# Patient Record
Sex: Male | Born: 1980 | Race: Black or African American | Hispanic: No | Marital: Single
Health system: Southern US, Community
[De-identification: ages and names within clinical notes are randomized; demographics above are authoritative.]

---

## 2002-08-06 ENCOUNTER — Emergency Department (HOSPITAL_COMMUNITY): Admission: EM | Admit: 2002-08-06 | Discharge: 2002-08-06 | Payer: Self-pay | Admitting: Emergency Medicine

## 2003-01-17 ENCOUNTER — Emergency Department (HOSPITAL_COMMUNITY): Admission: EM | Admit: 2003-01-17 | Discharge: 2003-01-17 | Payer: Self-pay | Admitting: Emergency Medicine

## 2003-01-17 ENCOUNTER — Encounter: Payer: Self-pay | Admitting: Emergency Medicine

## 2021-10-18 ENCOUNTER — Emergency Department (HOSPITAL_COMMUNITY)
Admission: EM | Admit: 2021-10-18 | Discharge: 2021-10-19 | Disposition: A | Discharge status: Home / Self Care | Payer: BC Managed Care – PPO | Attending: Emergency Medicine | Admitting: Emergency Medicine

## 2021-10-18 ENCOUNTER — Emergency Department (HOSPITAL_COMMUNITY): Payer: BC Managed Care – PPO

## 2021-10-18 ENCOUNTER — Other Ambulatory Visit: Payer: Self-pay

## 2021-10-18 DIAGNOSIS — R059 Cough, unspecified: Secondary | ICD-10-CM

## 2021-10-18 DIAGNOSIS — C3490 Malignant neoplasm of unspecified part of unspecified bronchus or lung: Secondary | ICD-10-CM

## 2021-10-18 DIAGNOSIS — C7931 Secondary malignant neoplasm of brain: Secondary | ICD-10-CM | POA: Insufficient documentation

## 2021-10-18 DIAGNOSIS — R569 Unspecified convulsions: Secondary | ICD-10-CM

## 2021-10-18 MED ORDER — LEVETIRACETAM IN NACL 1000 MG/100ML IV SOLN
1000.0000 mg | Freq: Once | INTRAVENOUS | Status: AC
  Administered 2021-10-19: 1000 mg via INTRAVENOUS
  Filled 2021-10-18: qty 100

## 2021-10-18 MED ORDER — PROCHLORPERAZINE EDISYLATE 10 MG/2ML IJ SOLN
10.0000 mg | Freq: Once | INTRAMUSCULAR | Status: AC
  Administered 2021-10-19: 10 mg via INTRAVENOUS
  Filled 2021-10-18: qty 2

## 2021-10-18 MED ORDER — SODIUM CHLORIDE 0.9 % IV BOLUS
1000.0000 mL | Freq: Once | INTRAVENOUS | Status: AC
  Administered 2021-10-19: 1000 mL via INTRAVENOUS

## 2021-10-18 NOTE — ED Provider Notes (Signed)
[place] EMERGENCY DEPARTMENT Provider Note  CSN: [id number] Arrival date & time: 10/18/21 2250  Chief Complaint(s) Seizures  HPI Andrew Baxter is a 41 y.o. male    Seizures Seizure activity on arrival: no   Seizure type:  Grand mal Episode characteristics: generalized shaking   Postictal symptoms: confusion and somnolence   Return to baseline: yes   Severity:  Moderate Duration:  5 minutes Timing:  Once Progression:  Resolved Context comment:  H/o metastatic lung cancer to brain (worsening tumor burden noted on MRI from [place] taken last week) History of seizures: yes (patient has had several seizures in the past. once every several years. not on AE)    Past Medical History No past medical history on file. There are no problems to display for this patient.  Home Medication(s) Prior to Admission medications   Medication Sig Start Date End Date Taking? Authorizing Provider  dexamethasone (DECADRON) 4 MG tablet Take 1.5 tablets (6 mg total) by mouth 4 (four) times daily for 5 days. 10/19/21 10/24/21 Yes Coal Nearhood, Grayce Sessions, MD  levETIRAcetam (KEPPRA) 500 MG tablet Take 1 tablet (500 mg total) by mouth 2 (two) times daily for 5 days. 10/19/21 10/24/21 Yes Isaiah Torok, Grayce Sessions, MD  NAPROXEN PO Take 1 tablet by mouth daily as needed (mild pain).   Yes [provider]  KEYTRUDA 100 MG/4ML SOLN Inject into the vein. Patient not taking: Reported on 10/19/2021 09/09/21   [provider]                                                                                                                                    Allergies Penicillins, Pork-derived products, and Shrimp (diagnostic)  Review of Systems Review of Systems  Neurological:  Positive for seizures.  As noted in HPI  Physical Exam Vital Signs  I have reviewed the triage vital signs BP 107/67    Pulse 81    Temp 98.9 F (37.2 C) (Oral)    Resp 16    Ht _0  (1.753 m)     Wt 68 kg    SpO2 95%    BMI 22.15 kg/m   Physical Exam Vitals reviewed.  Constitutional:      General: He is not in acute distress.    Appearance: He is well-developed. He is not diaphoretic.  HENT:     Head: Normocephalic and atraumatic.     Nose: Nose normal.  Eyes:     General: No scleral icterus.       Right eye: No discharge.        Left eye: No discharge.     Conjunctiva/sclera: Conjunctivae normal.     Pupils: Pupils are equal, round, and reactive to light.  Cardiovascular:     Rate and Rhythm: Normal rate and regular rhythm.     Heart sounds: No murmur heard.   No friction rub. No gallop.  Pulmonary:  Effort: Pulmonary effort is normal. No respiratory distress.     Breath sounds: Normal breath sounds. No stridor. No rales.  Abdominal:     General: There is no distension.     Palpations: Abdomen is soft.     Tenderness: There is no abdominal tenderness.  Musculoskeletal:        General: No tenderness.     Cervical back: Normal range of motion and neck supple.  Skin:    General: Skin is warm and dry.     Findings: No erythema or rash.  Neurological:     Mental Status: He is alert and oriented to person, place, and time.     Comments: Mental Status:  Alert and oriented to person, place, and time.  Attention and concentration normal.  Speech clear.  Recent memory is intact  Cranial Nerves:  II Visual Fields: Intact to confrontation. Visual fields intact. III, IV, VI: Pupils equal and reactive to light and near. Full eye movement without nystagmus  V Facial Sensation: Normal. No weakness of masticatory muscles  VII: No facial weakness or asymmetry  VIII Auditory Acuity: Grossly normal  IX/X: The uvula is midline; the palate elevates symmetrically  XI: Normal sternocleidomastoid and trapezius strength  XII: The tongue is midline. No atrophy or fasciculations.   Motor System: Muscle Strength: 5/5 and symmetric in the upper and lower extremities. No pronation  or drift.  Muscle Tone: Tone and muscle bulk are normal in the upper and lower extremities.  Coordination: Intact finger-to-nose No tremor.  Sensation: Intact to light touch      ED Results and Treatments Labs (all labs ordered are listed, but only abnormal results are displayed) Labs Reviewed  CBC WITH DIFFERENTIAL/PLATELET - Abnormal; Notable for the following components:      Result Value   Lymphs Abs 0.4 (*)    All other components within normal limits  COMPREHENSIVE METABOLIC PANEL - Abnormal; Notable for the following components:   Glucose, Bld 108 (*)    Calcium 8.1 (*)    All other components within normal limits  PHOSPHORUS - Abnormal; Notable for the following components:   Phosphorus 1.4 (*)    All other components within normal limits                                                                                                                         EKG  EKG Interpretation  Date/Time:    Ventricular Rate:    PR Interval:    QRS Duration:   QT Interval:    QTC Calculation:   R Axis:     Text Interpretation:         Radiology CT Head Wo Contrast  Result Date: 10/18/2021 CLINICAL DATA:  Seizure disorder, history of metastatic brain cancer EXAM: CT HEAD WITHOUT CONTRAST TECHNIQUE: Contiguous axial images were obtained from the base of the skull through the vertex without intravenous contrast. RADIATION DOSE REDUCTION: This exam was performed according to the departmental  dose-optimization program which includes automated exposure control, adjustment of the mA and/or kV according to patient size and/or use of iterative reconstruction technique. COMPARISON:  None. FINDINGS: Brain: There is a 1.8 x 1.3 x 1.8 cm hyperdense mass within the inferior aspect of the right temporal lobe, with mild surrounding edema, consistent with given history of metastatic disease. No other masses identified on this unenhanced exam. If further evaluation is desired, brain MRI with and  without contrast would be useful. Encephalomalacia within the right occipital lobe, with evidence of prior craniotomy, consistent with the cephalo malacia and likely from prior mass resection. No evidence of acute infarct or hemorrhage. The lateral ventricles and midline structures are otherwise unremarkable. No acute extra-axial fluid collections. No mass effect. Vascular: No hyperdense vessel or unexpected calcification. Skull: Previous right occipital craniotomy. No acute or destructive bony lesions. Sinuses/Orbits: No acute finding. Other: None. IMPRESSION: 1. 1.8 cm hyperdense mass within the inferior aspect of the right temporal lobe, consistent with given history of metastatic disease. Mild surrounding edema without significant mass effect. No other intracranial masses. If further evaluation is desired, MRI could be performed. 2. Right occipital encephalomalacia with overlying changes of prior craniotomy, consistent with prior mass resection. 3. No evidence of acute infarct or hemorrhage. Electronically Signed   By: Randa Ngo M.D.   On: 10/18/2021 23:59   DG Chest Port 1 View  Result Date: 10/18/2021 CLINICAL DATA:  Cough and seizure EXAM: PORTABLE CHEST 1 VIEW COMPARISON:  None. FINDINGS: Marked leftward mediastinal shift with shallow inflation of the left lung, a medium-sized left pleural effusion that is circumferential and multifocal left airspace opacities. Right lung is clear. IMPRESSION: Medium-sized left pleural effusion and multifocal left airspace opacities. Area of consolidation at the left lung base could indicate atelectasis, infection and/or mass. Electronically Signed   By: Ulyses Jarred M.D.   On: 10/18/2021 23:54    Pertinent labs & imaging results that were available during my care of the patient were reviewed by me and considered in my medical decision making (see MDM for details).  Medications Ordered in ED Medications  sodium chloride 0.9 % bolus 1,000 mL (0 mLs Intravenous  Stopped 10/19/21 0234)  levETIRAcetam (KEPPRA) IVPB 1000 mg/100 mL premix (0 mg Intravenous Stopped 10/19/21 0029)  prochlorperazine (COMPAZINE) injection 10 mg (10 mg Intravenous Given 10/19/21 0029)  dexamethasone (DECADRON) injection 6 mg (6 mg Intravenous Given 10/19/21 0255)                                                                                                                                     Procedures Procedures  (including critical care time)  Medical Decision Making / ED Course        Seizure Patient with prior history of seizures not on antiepileptic's Known to have worsening metastatic lung cancer to the brain noted on record review of MRI from [facility], performed last week.  Currently getting chemotherapy infusion which she is scheduled for tomorrow.  Patient also scheduled for radiation therapy on January 24. Will get screening labs to assess for any electrolyte derangements.  CT head to assess for any complication from metastatic disease including vasogenic edema or bleeding. Patient also reported known pleural effusion. Started having cough similar to prior large effusion requiring thoracentesis. Will get CXR to assess for effusion size and to rule out PNA  Work-up ordered to assess concerns above. Labs and imagine Independently interpreted by me and noted below: CT head notable for mild edema to right temporal region. Likely associated with a met. No obvious bleed. Radiology confirmed and noted additional findings above. CXR notable for moderate left pleural effusion with low aeration. When compared to reads of CXR/CTs from OSH, it appears to be similar.  Management: Seizure Initial management included IVF Keppra load Started complaining of headache and nausea. Given compazine Headache and nausea improved Known metastatic brain lesion on CT with mild edema to right temporal region. Consulted [facility] and spoke with Dr. Isabella Stalling who  recommended overnight observation, 30m decadron q6hr and agreed with Keppra. He feels patient can be discharged in the morning and call clinic so they can work him in for infusion and follow up.   Reassessment: 6:41 AM Remained HDS. No hypoxia. No recurrent seizure activity    Final Clinical Impression(s) / ED Diagnoses Final diagnoses:  Cough  Seizure ([facility]  Lung cancer metastatic to brain ([facility]   The patient appears reasonably screened and/or stabilized for discharge and I doubt any other medical condition or [facility], evaluation, or treatment in the ED at this time prior to discharge. Safe for discharge with strict return precautions.  Disposition: Discharge  Condition: Good  I have discussed the results, Dx and Tx plan with the patient/family who expressed understanding and agree(s) with the plan. Discharge instructions discussed at length. The patient/family was given strict return precautions who verbalized understanding of the instructions. No further questions at time of discharge.    ED Discharge Orders          Ordered    dexamethasone (DECADRON) 4 MG tablet  4 times daily        10/19/21 0618    levETIRAcetam (KEPPRA) 500 MG tablet  2 times daily        10/19/21 06712             Follow Up: HPernell Dupre MD [address] [id number] [phone] Call  to schedule close follow up for your infusion and reassessment            This chart was dictated using voice recognition software.  Despite best efforts to proofread,  errors can occur which can change the documentation meaning.    CFatima Blank MD 10/19/21 [phone]

## 2021-10-18 NOTE — ED Notes (Signed)
Mother at bedside.

## 2021-10-18 NOTE — ED Triage Notes (Addendum)
Pt arrived via [facility] for cc of seizure. Pt was in car with family, witnessed ~5 minute seizure. PMH of metastatic brain cancer (bone, aorta), receiving treatment at this time. A&Ox4, GCS 15 at time of EMS arrival. No mouth trauma, no incontinence.18g LAC.  BP 114/74 HR 98 SPO2 97% RA RR 16 CBG 137

## 2021-10-19 LAB — COMPREHENSIVE METABOLIC PANEL
ALT: 16 U/L (ref 0–44)
AST: 27 U/L (ref 15–41)
Albumin: 3.6 g/dL (ref 3.5–5.0)
Alkaline Phosphatase: 112 U/L (ref 38–126)
Anion gap: 8 (ref 5–15)
BUN: 10 mg/dL (ref 6–20)
CO2: 23 mmol/L (ref 22–32)
Calcium: 8.1 mg/dL — ABNORMAL LOW (ref 8.9–10.3)
Chloride: 104 mmol/L (ref 98–111)
Creatinine, Ser: 0.89 mg/dL (ref 0.61–1.24)
GFR, Estimated: >60 mL/min (ref >60)
Glucose, Bld: 108 mg/dL — ABNORMAL HIGH (ref 70–99)
Potassium: 4 mmol/L (ref 3.5–5.1)
Sodium: 135 mmol/L (ref 135–145)
Total Bilirubin: 0.9 mg/dL (ref 0.3–1.2)
Total Protein: 7.1 g/dL (ref 6.5–8.1)

## 2021-10-19 LAB — CBC WITH DIFFERENTIAL/PLATELET
Abs Immature Granulocytes: 0.04 10*3/uL (ref 0.00–0.07)
Basophils Absolute: 0 10*3/uL (ref 0.0–0.1)
Basophils Relative: 0 %
Eosinophils Absolute: 0.1 10*3/uL (ref 0.0–0.5)
Eosinophils Relative: 2 %
HCT: 41.9 % (ref 39.0–52.0)
Hemoglobin: 13.9 g/dL (ref 13.0–17.0)
Immature Granulocytes: 1 %
Lymphocytes Relative: 10 %
Lymphs Abs: 0.4 10*3/uL — ABNORMAL LOW (ref 0.7–4.0)
MCH: 28.5 pg (ref 26.0–34.0)
MCHC: 33.2 g/dL (ref 30.0–36.0)
MCV: 86 fL (ref 80.0–100.0)
Monocytes Absolute: 0.8 10*3/uL (ref 0.1–1.0)
Monocytes Relative: 19 %
Neutro Abs: 2.8 10*3/uL (ref 1.7–7.7)
Neutrophils Relative %: 68 %
Platelets: 207 10*3/uL (ref 150–400)
RBC: 4.87 MIL/uL (ref 4.22–5.81)
RDW: 15.4 % (ref 11.5–15.5)
WBC: 4.1 10*3/uL (ref 4.0–10.5)
nRBC: 0 % (ref 0.0–0.2)

## 2021-10-19 LAB — PHOSPHORUS: Phosphorus: 1.4 mg/dL — ABNORMAL LOW (ref 2.5–4.6)

## 2021-10-19 MED ORDER — LEVETIRACETAM 500 MG PO TABS: 500.0000 mg | ORAL_TABLET | Freq: Two times a day (BID) | ORAL | 0 refills | Status: AC

## 2021-10-19 MED ORDER — DEXAMETHASONE 4 MG PO TABS
6.0000 mg | ORAL_TABLET | Freq: Four times a day (QID) | ORAL | 0 refills | Status: AC
Start: 2021-10-19 — End: 2021-10-24

## 2021-10-19 MED ORDER — DEXAMETHASONE SODIUM PHOSPHATE 10 MG/ML IJ SOLN
6.0000 mg | Freq: Once | INTRAMUSCULAR | Status: AC
  Administered 2021-10-19: 6 mg via INTRAVENOUS
  Filled 2021-10-19: qty 1

## 2021-10-19 NOTE — ED Notes (Signed)
Pt verbalized understanding of d/c instructions, meds and followup care. Denies questions. VSS, no distress noted. Steady gait to exit with all belongings.  ?

## 2021-10-19 NOTE — ED Notes (Signed)
Is a patient of Dr. Arletha Grippe at [facility], paged doctor on call, Dr. Isabella Stalling for Dr. Leonette Monarch

## 2021-10-19 NOTE — Discharge Instructions (Addendum)
I sent  your prescriptions to the local 24 hour [facility] so you can have your medication before leaving town. Please take your Keppra and decadron around 8 am.  Please call the oncology office so they can work you in today for your infusion and close follow up.

## 2023-03-05 DEATH — deceased

## 2023-12-11 IMAGING — CT CT HEAD W/O CM
4 series · 14 of 47 positions shown, 16 images · non-contrast
Comparison: None.

CLINICAL DATA: Seizure disorder, history of metastatic brain cancer



[Series 3: head without · axial · non-contrast · 0.47mm/px · z∈[-138,-18]mm · 7 of 33 slices shown, 9 images]
[im 5/33  brain]
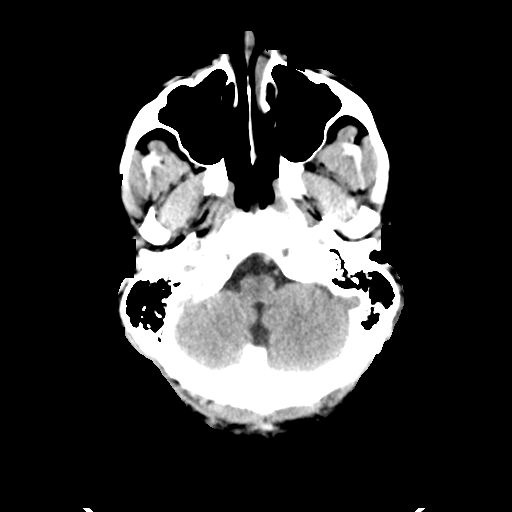
[im 5/33  bone]
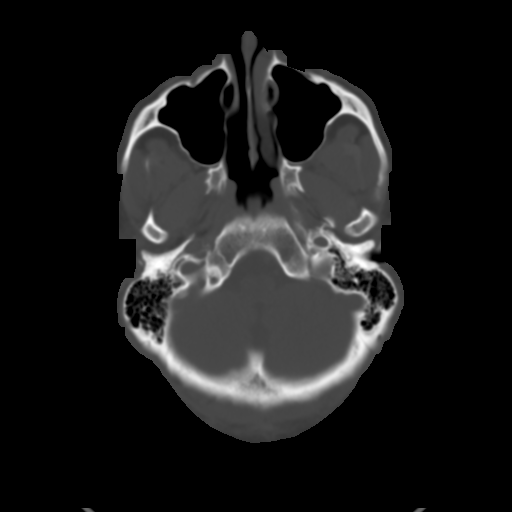
[im 9/33  brain]
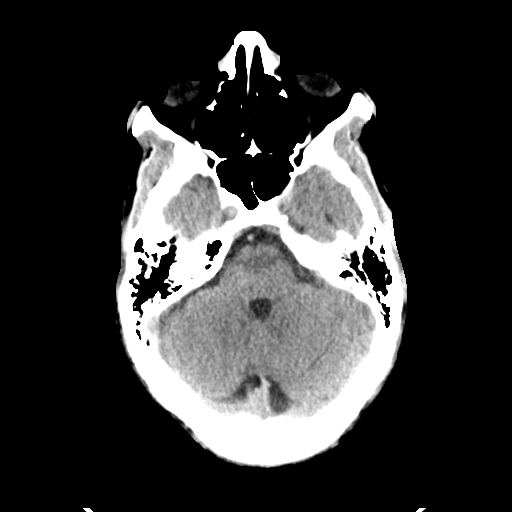
[im 13/33  brain]
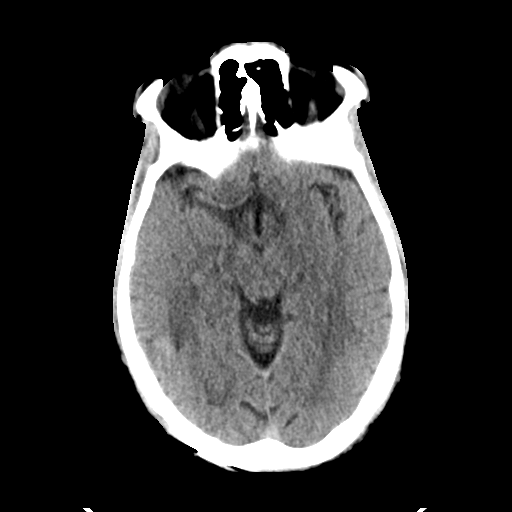
[im 17/33  brain]
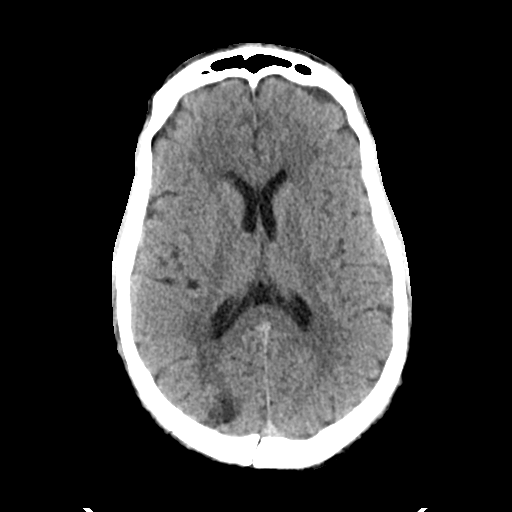
[im 21/33  brain]
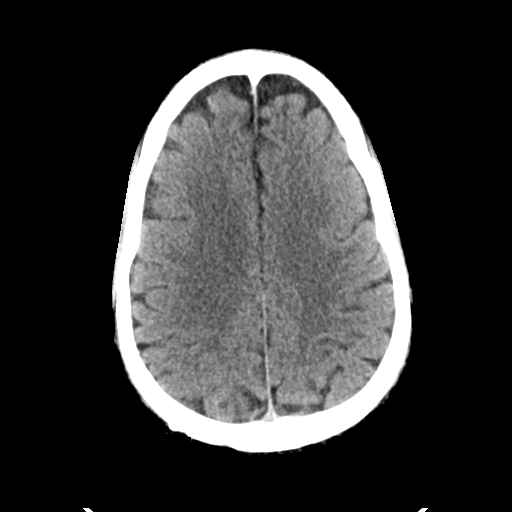
[im 21/33  bone]
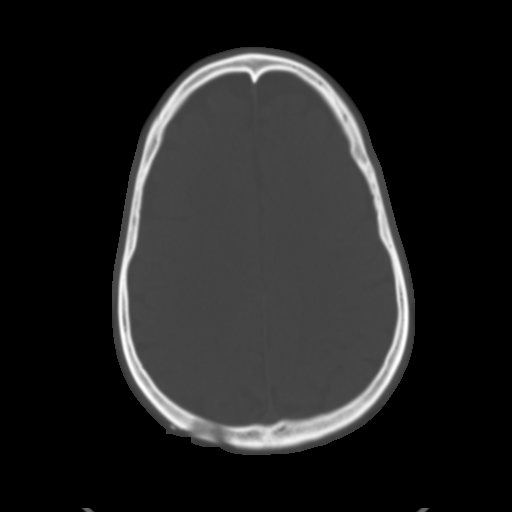
[im 25/33  brain]
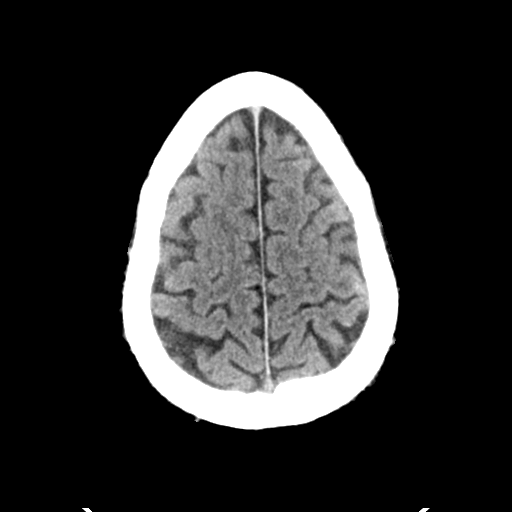
[im 29/33  brain]
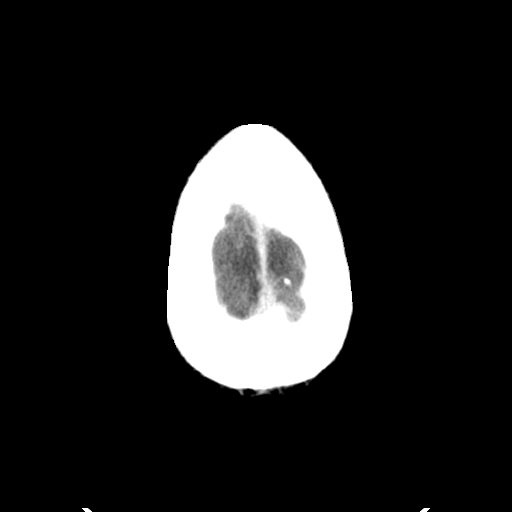

[Series 4: head bone · axial · 0.47mm/px · 1 of 82 slices shown]
[im 9/82  bone]
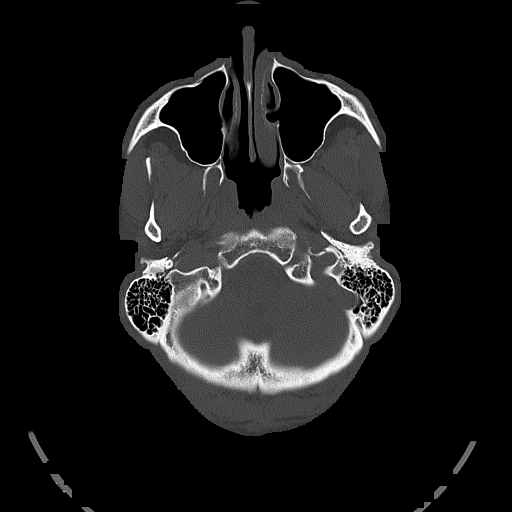

[Series 5: head without cor · coronal · non-contrast · 0.30mm/px · 3 of 77 slices shown]
[im 26/77  brain]
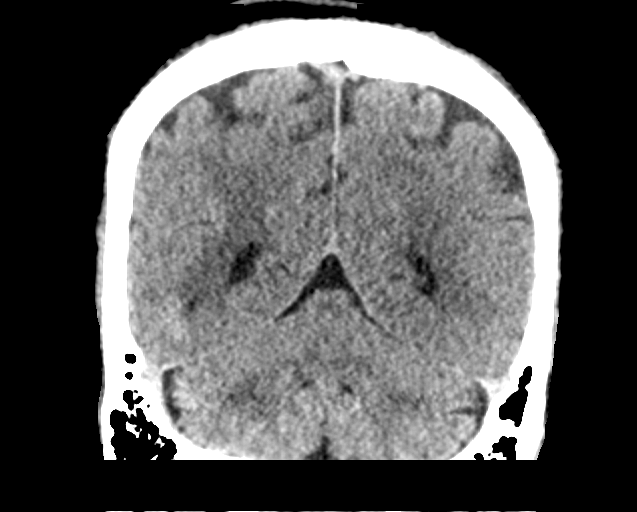
[im 34/77  brain]
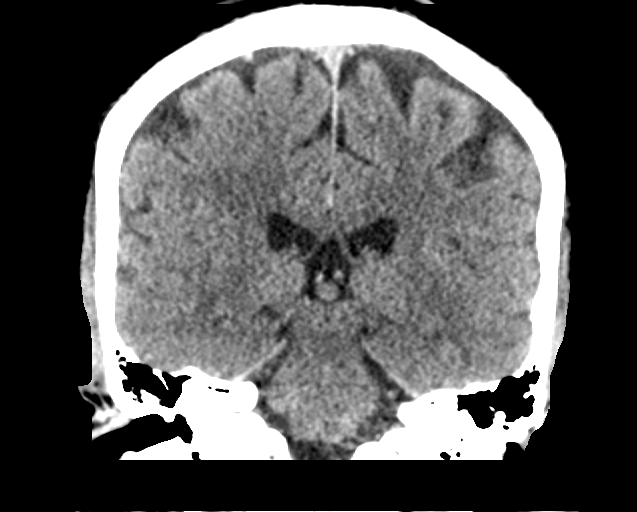
[im 43/77  brain]
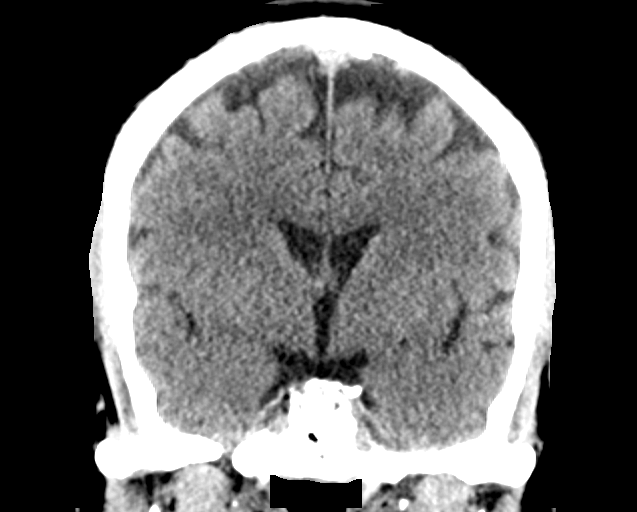

[Series 6: head without sag · sagittal · non-contrast · 0.29mm/px · 3 of 63 slices shown]
[im 21/63  brain]
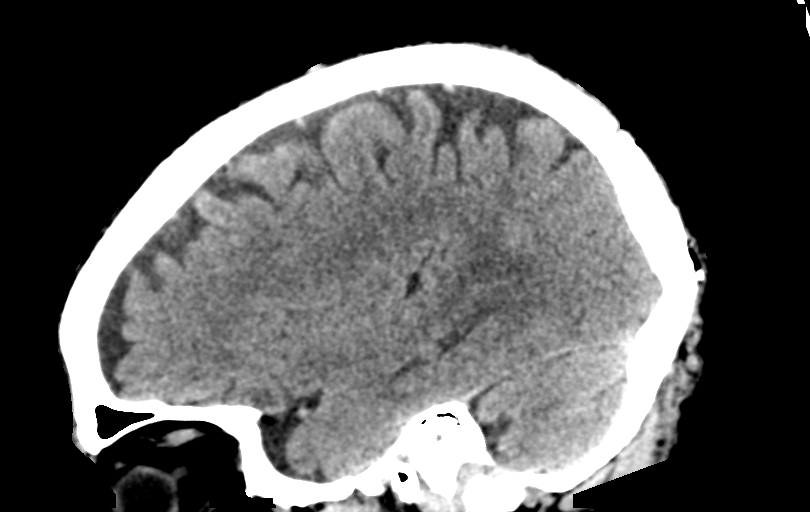
[im 32/63  brain]
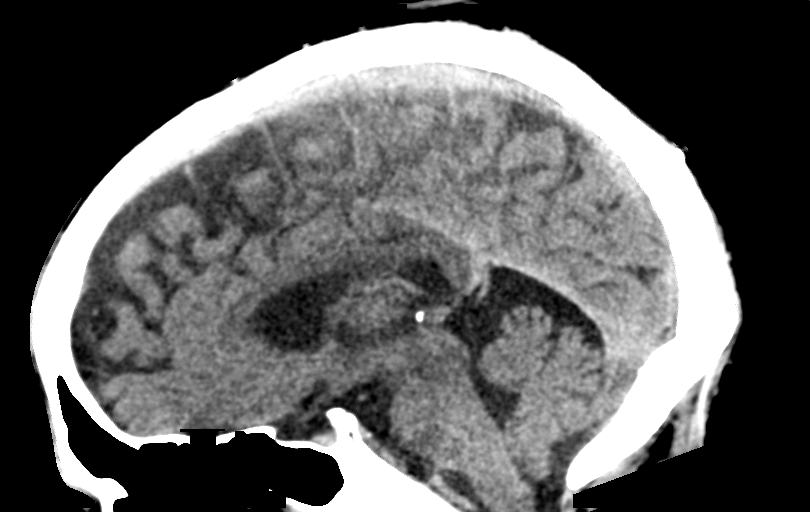
[im 42/63  brain]
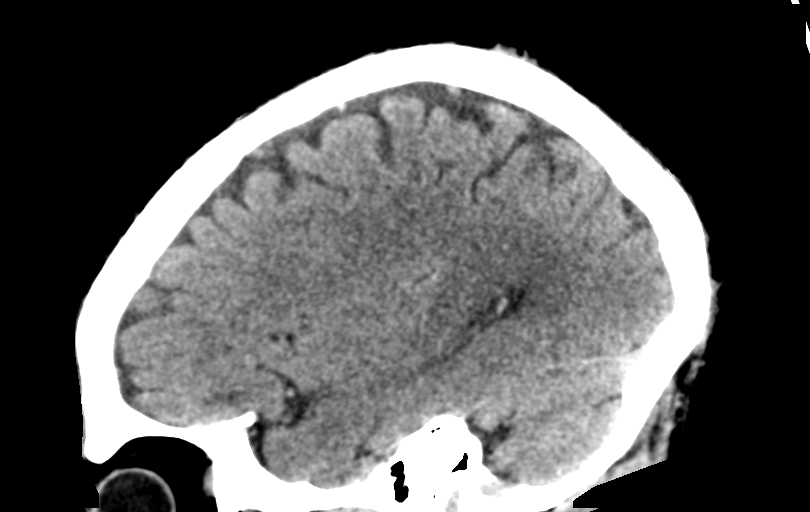

[14 of 47 positions shown; findings below may reference images not displayed]

FINDINGS: Brain: There is a 1.8 x 1.3 x 1.8 cm hyperdense mass within the
inferior aspect of the right temporal lobe, with mild surrounding
edema, consistent with given history of metastatic disease. No other
masses identified on this unenhanced exam. If further evaluation is
desired, brain MRI with and without contrast would be useful.

Encephalomalacia within the right occipital lobe, with evidence of
prior craniotomy, consistent with the cephalo malacia and likely
from prior mass resection.

No evidence of acute infarct or hemorrhage. The lateral ventricles
and midline structures are otherwise unremarkable. No acute
extra-axial fluid collections. No mass effect.

Vascular: No hyperdense vessel or unexpected calcification.

Skull: Previous right occipital craniotomy. No acute or destructive
bony lesions.

Sinuses/Orbits: No acute finding.

Other: None.
IMPRESSION: 1. 1.8 cm hyperdense mass within the inferior aspect of the right
temporal lobe, consistent with given history of metastatic disease.
Mild surrounding edema without significant mass effect. No other
intracranial masses. If further evaluation is desired, MRI could be
performed.
2. Right occipital encephalomalacia with overlying changes of prior
craniotomy, consistent with prior mass resection.
3. No evidence of acute infarct or hemorrhage.
